# Patient Record
Sex: Female | Born: 1960 | Race: White | Hispanic: No | Marital: Single
Health system: Southern US, Community
[De-identification: ages and names within clinical notes are randomized; demographics above are authoritative.]

---

## 2020-03-26 ENCOUNTER — Emergency Department (HOSPITAL_COMMUNITY)
Admission: EM | Admit: 2020-03-26 | Discharge: 2020-03-26 | Disposition: A | Discharge status: Home / Self Care | Payer: 59 | Attending: Emergency Medicine | Admitting: Emergency Medicine

## 2020-03-26 ENCOUNTER — Emergency Department (HOSPITAL_COMMUNITY): Payer: 59

## 2020-03-26 ENCOUNTER — Encounter (HOSPITAL_COMMUNITY): Payer: Self-pay | Admitting: Emergency Medicine

## 2020-03-26 DIAGNOSIS — R0789 Other chest pain: Secondary | ICD-10-CM | POA: Insufficient documentation

## 2020-03-26 MED ORDER — LORAZEPAM 0.5 MG PO TABS
0.5000 mg | ORAL_TABLET | Freq: Once | ORAL | Status: AC
  Administered 2020-03-26: 0.5 mg via ORAL
  Filled 2020-03-26: qty 1
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
## 2020-03-26 NOTE — Discharge Instructions (Addendum)
Please return for any problem.  Follow-up with your regular care provider as instructed. °

## 2020-03-26 NOTE — ED Notes (Signed)
Pt verbalizes understanding of DC instructions. Pt belongings returned and is ambulatory out of ED.  

## 2020-03-26 NOTE — ED Notes (Signed)
Pt ambulatory independently to RR. Pt states " can I just go I want to go".   Messick MD made aware

## 2021-01-18 IMAGING — CR DG CHEST 2V
2 series · 2 of 2 positions shown · non-contrast
Comparison: None.

CLINICAL DATA: Pain

EXAM:
CHEST - 2 VIEW

[w chest pa]
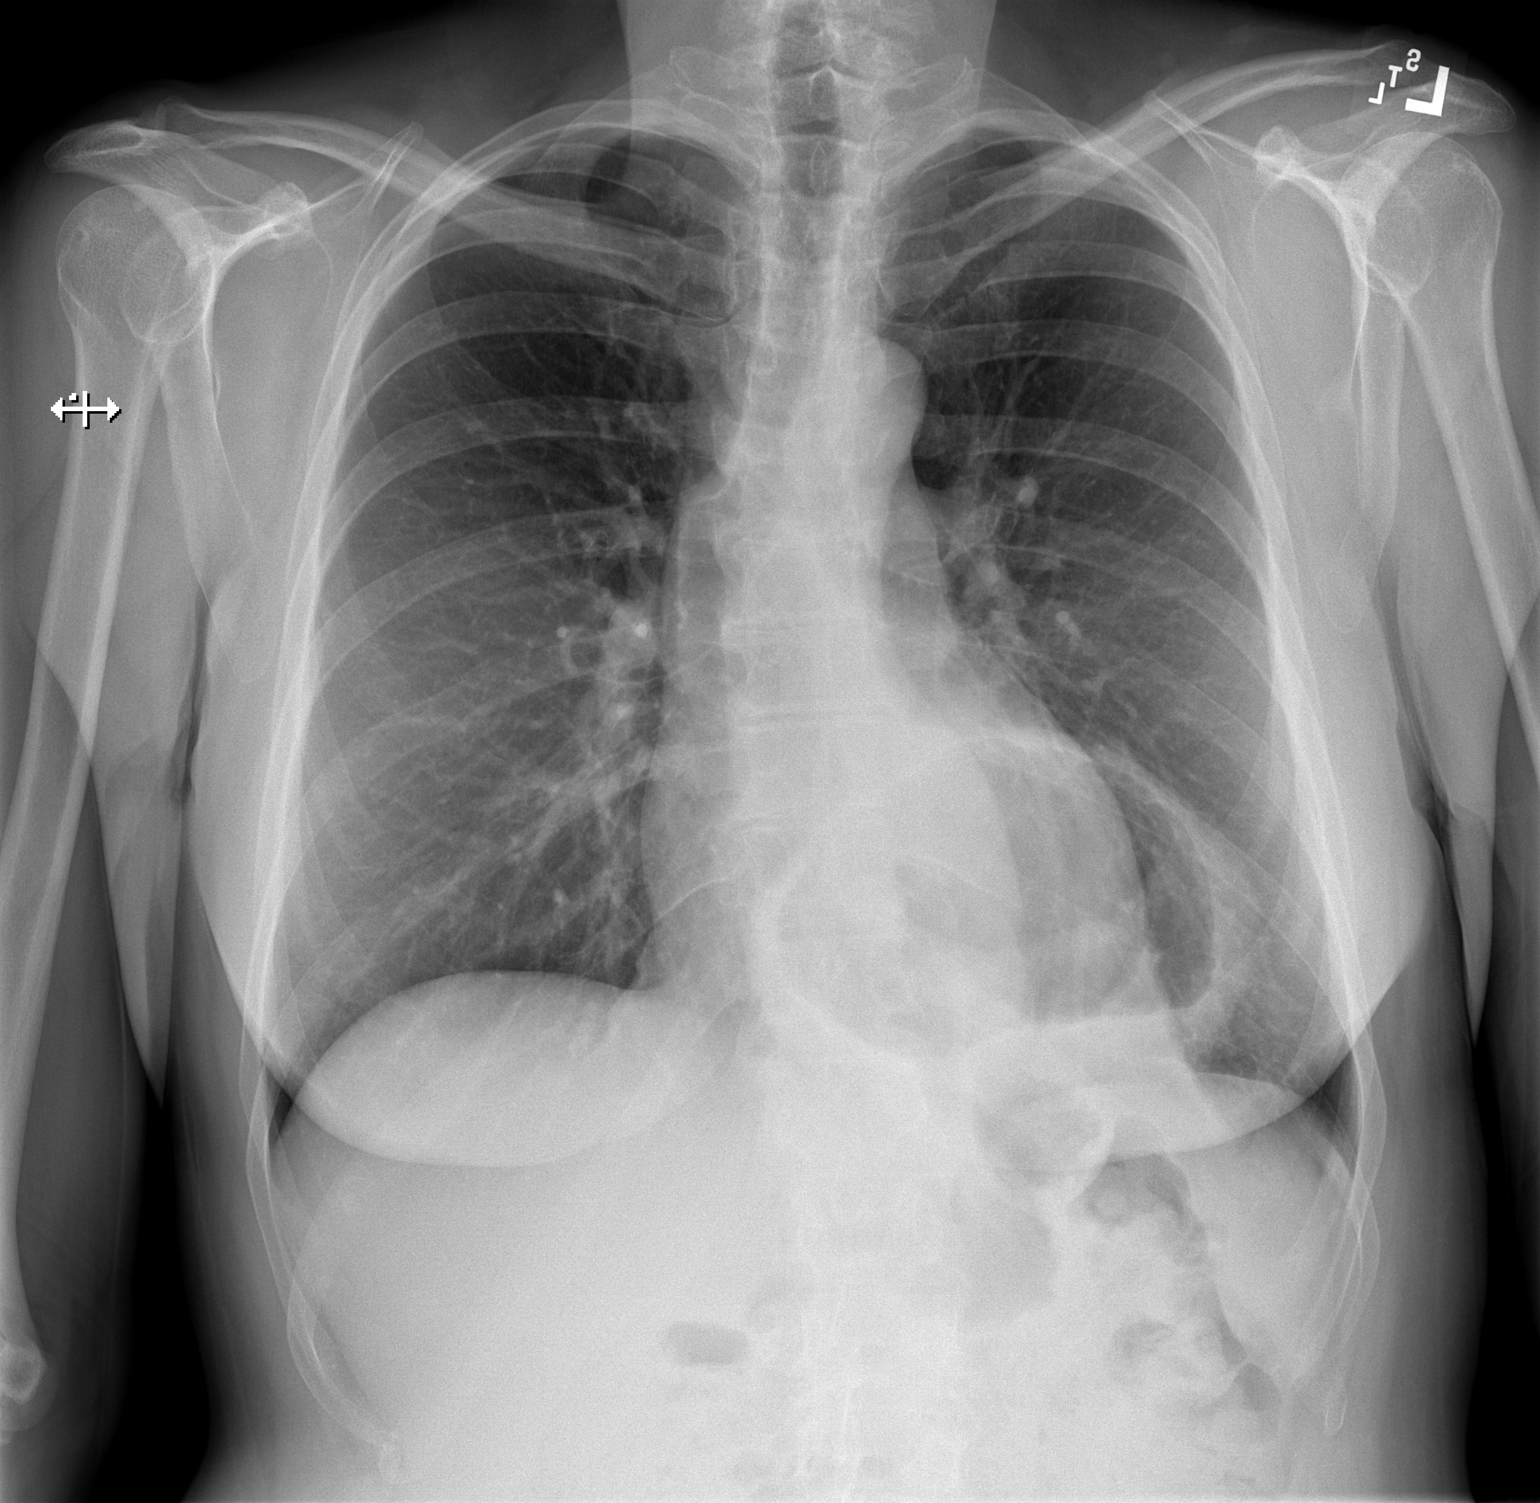

[w chest lat]
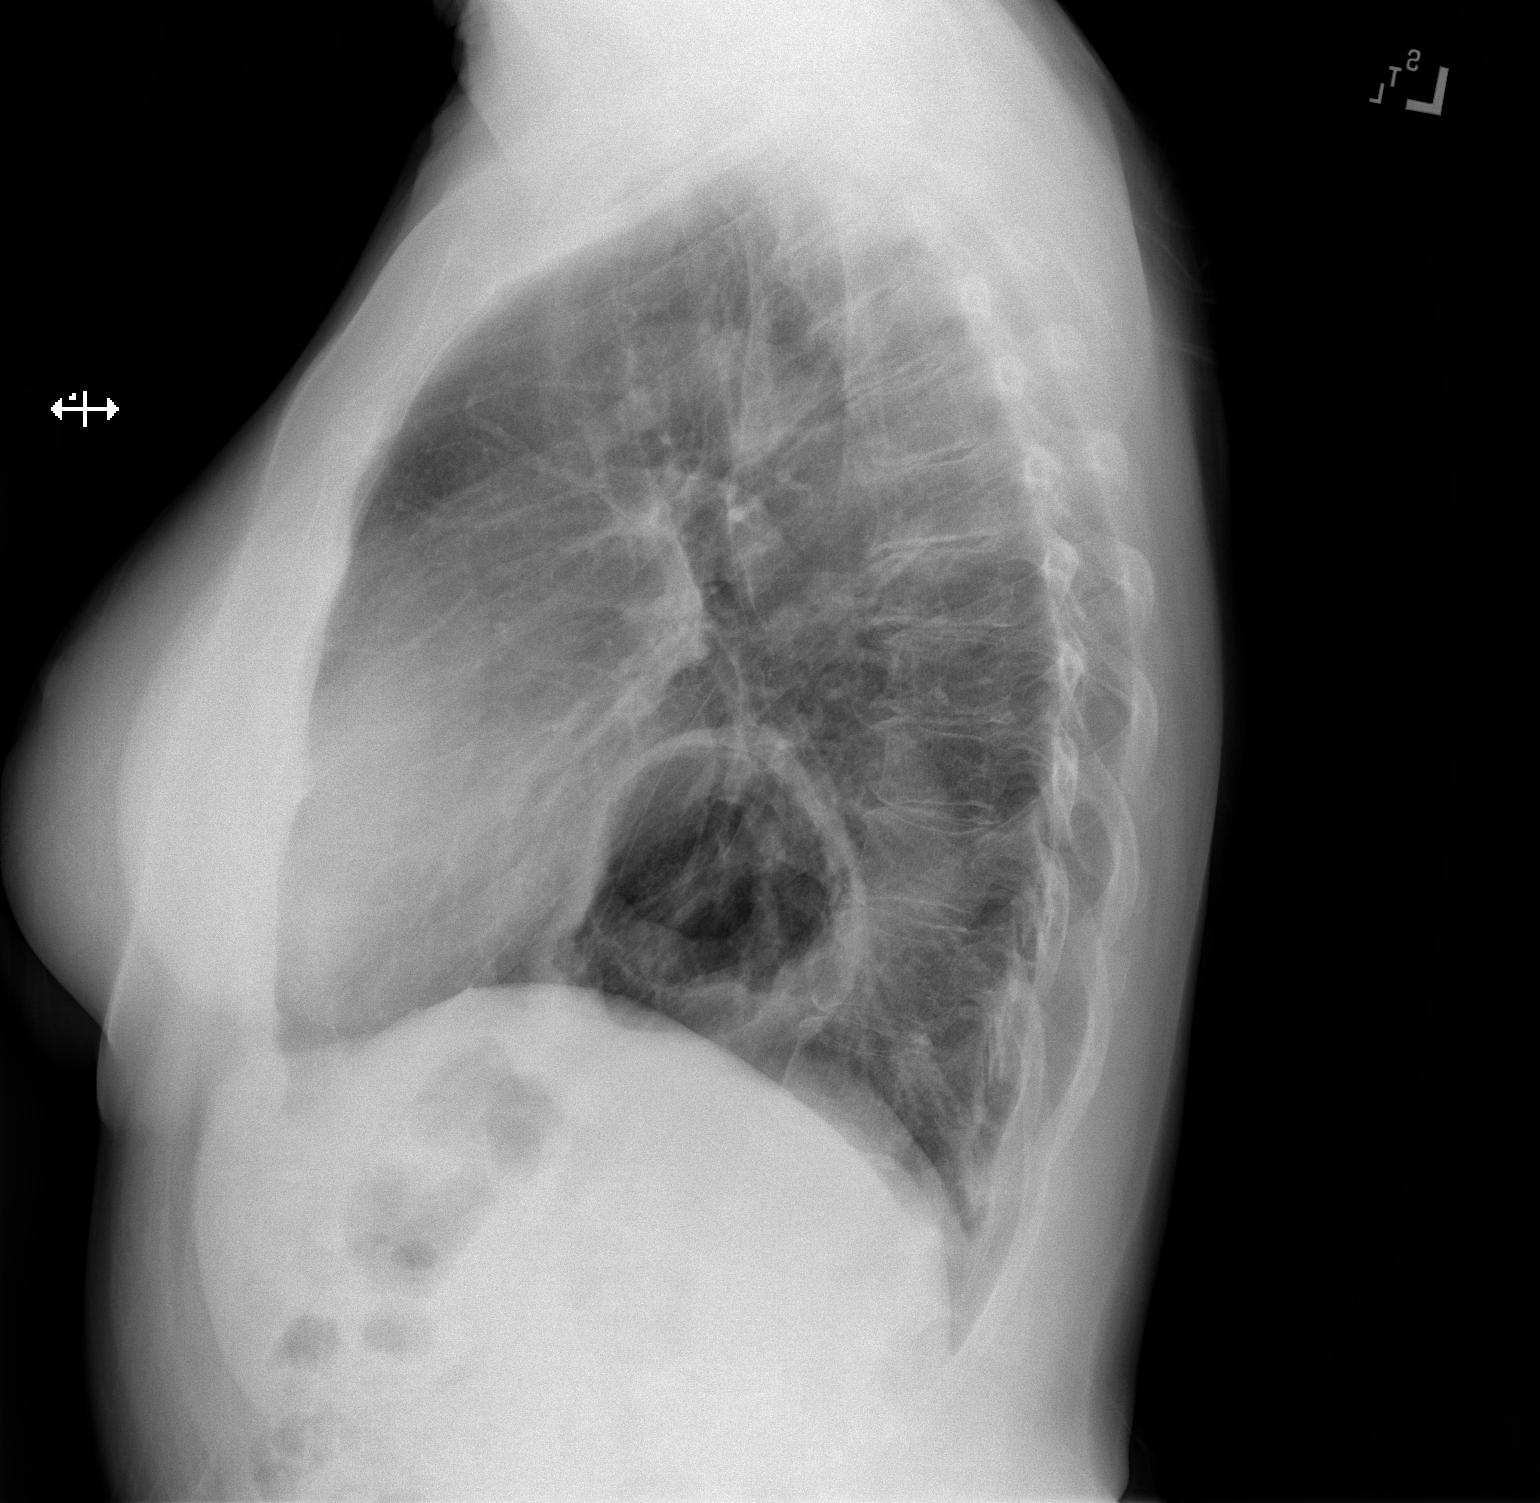

[2 of 2 positions shown; findings below may reference images not displayed]

FINDINGS: There is a large hiatal hernia. There is no pneumothorax. No large
pleural effusion. No definite acute displaced fracture. The heart
size is normal. There may be mild atherosclerotic changes of the
thoracic aorta.
IMPRESSION: 1. No acute cardiopulmonary process.
2. Large hiatal hernia.
